# Patient Record
Sex: Female | Born: 2000 | Race: White | Hispanic: No | Smoking: Current some day smoker
Health system: Southern US, Community
[De-identification: ages and names within clinical notes are randomized; demographics above are authoritative.]

## PROBLEM LIST (undated history)

## (undated) DIAGNOSIS — F32A Depression, unspecified: Secondary | ICD-10-CM

## (undated) DIAGNOSIS — M329 Systemic lupus erythematosus, unspecified: Secondary | ICD-10-CM

## (undated) DIAGNOSIS — F419 Anxiety disorder, unspecified: Secondary | ICD-10-CM

## (undated) DIAGNOSIS — IMO0002 Reserved for concepts with insufficient information to code with codable children: Secondary | ICD-10-CM

## (undated) DIAGNOSIS — Z8619 Personal history of other infectious and parasitic diseases: Secondary | ICD-10-CM

## (undated) HISTORY — PX: WISDOM TOOTH EXTRACTION: SHX21

## (undated) HISTORY — PX: APPENDECTOMY: SHX54

## (undated) HISTORY — DX: Depression, unspecified: F32.A

## (undated) HISTORY — DX: Anxiety disorder, unspecified: F41.9

## (undated) HISTORY — DX: Personal history of other infectious and parasitic diseases: Z86.19

---
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
## 2019-02-25 ENCOUNTER — Other Ambulatory Visit: Payer: Self-pay

## 2019-02-25 DIAGNOSIS — Z8739 Personal history of other diseases of the musculoskeletal system and connective tissue: Secondary | ICD-10-CM | POA: Diagnosis not present

## 2019-02-25 DIAGNOSIS — Z79899 Other long term (current) drug therapy: Secondary | ICD-10-CM | POA: Diagnosis not present

## 2019-02-25 DIAGNOSIS — R0789 Other chest pain: Secondary | ICD-10-CM | POA: Diagnosis present

## 2019-02-25 DIAGNOSIS — J9801 Acute bronchospasm: Secondary | ICD-10-CM | POA: Insufficient documentation

## 2019-02-25 LAB — CBC
HCT: 42 % (ref 36.0–46.0)
Hemoglobin: 14.3 g/dL (ref 12.0–15.0)
MCH: 28.9 pg (ref 26.0–34.0)
MCHC: 34 g/dL (ref 30.0–36.0)
MCV: 85 fL (ref 80.0–100.0)
Platelets: 349 10*3/uL (ref 150–400)
RBC: 4.94 MIL/uL (ref 3.87–5.11)
RDW: 11.6 % (ref 11.5–15.5)
WBC: 7.7 10*3/uL (ref 4.0–10.5)
nRBC: 0 % (ref 0.0–0.2)

## 2019-02-25 MED ORDER — SODIUM CHLORIDE 0.9% FLUSH: 3.0000 mL | Freq: Once | INTRAVENOUS | Status: DC

## 2019-02-25 NOTE — ED Triage Notes (Signed)
Pt to the er for chest pain that is in the middle chest and then spreads across the whole chest and into the shoulders and her back. Pt states it makes it hard for her to breathe and pain is worse with breathing and lying down.

## 2019-02-26 ENCOUNTER — Emergency Department
Admission: EM | Admit: 2019-02-26 | Discharge: 2019-02-26 | Disposition: A | Discharge status: Home / Self Care | Payer: Medicaid Other | Attending: Emergency Medicine | Admitting: Emergency Medicine

## 2019-02-26 ENCOUNTER — Emergency Department: Payer: Medicaid Other

## 2019-02-26 DIAGNOSIS — J9801 Acute bronchospasm: Secondary | ICD-10-CM

## 2019-02-26 DIAGNOSIS — R0789 Other chest pain: Secondary | ICD-10-CM

## 2019-02-26 HISTORY — DX: Systemic lupus erythematosus, unspecified: M32.9

## 2019-02-26 HISTORY — DX: Reserved for concepts with insufficient information to code with codable children: IMO0002

## 2019-02-26 LAB — BASIC METABOLIC PANEL
Anion gap: 8 (ref 5–15)
BUN: 12 mg/dL (ref 6–20)
CO2: 25 mmol/L (ref 22–32)
Calcium: 9.2 mg/dL (ref 8.9–10.3)
Chloride: 104 mmol/L (ref 98–111)
Creatinine, Ser: 0.69 mg/dL (ref 0.44–1.00)
GFR calc Af Amer: >60 mL/min (ref >60)
GFR calc non Af Amer: >60 mL/min (ref >60)
Glucose, Bld: 79 mg/dL (ref 70–99)
Potassium: 4 mmol/L (ref 3.5–5.1)
Sodium: 137 mmol/L (ref 135–145)

## 2019-02-26 LAB — TROPONIN I (HIGH SENSITIVITY)
Troponin I (High Sensitivity): <2 ng/L (ref <18)
Troponin I (High Sensitivity): <2 ng/L (ref <18)

## 2019-02-26 LAB — FIBRIN DERIVATIVES D-DIMER (ARMC ONLY): Fibrin derivatives D-dimer (ARMC): 434 ng/mL (FEU) (ref 0.00–499.00)

## 2019-02-26 MED ORDER — ALBUTEROL SULFATE (2.5 MG/3ML) 0.083% IN NEBU
5.0000 mg | INHALATION_SOLUTION | Freq: Once | RESPIRATORY_TRACT | Status: AC
  Administered 2019-02-26: 5 mg via RESPIRATORY_TRACT

## 2019-02-26 MED ORDER — ALBUTEROL SULFATE HFA 108 (90 BASE) MCG/ACT IN AERS: 2.0000 | INHALATION_SPRAY | Freq: Four times a day (QID) | RESPIRATORY_TRACT | 0 refills | Status: DC | PRN

## 2019-02-26 NOTE — ED Notes (Signed)
ED Provider at bedside. 

## 2019-02-26 NOTE — ED Provider Notes (Signed)
[facility] Emergency Department Provider Note  ____________________________________________  Time seen: Approximately 4:44 AM  I have reviewed the triage vital signs and the nursing notes.   HISTORY  Chief Complaint Chest Pain   HPI Victoria Palmer is a 18 y.o. female   with a history of discoid lupus who presents for evaluation of chest pain.  Patient reports 3 days of constant chest tightness.  She denies shortness of breath but reports that the pain makes it harder for her to breathe.  The pain is worse when she lays down and better when she stands up.  No cough, no fever, no history of asthma, no history of smoking.  No recent illness, no nausea or vomiting.  No personal or family history of blood clots, no recent travel immobilization, no leg pain or swelling, no hemoptysis.  Patient is on birth control.  Currently on her menstrual period.  Past Medical History:  Diagnosis Date  . Lupus (HCC)     There are no active problems to display for this patient.   Past Surgical History:  Procedure Laterality Date  . APPENDECTOMY    . WISDOM TOOTH EXTRACTION      Prior to Admission medications   Medication Sig Start Date End Date Taking? Authorizing Provider  acetaminophen (TYLENOL) 325 MG tablet Take 650 mg by mouth every 6 (six) hours as needed. fever     [provider]  albuterol (VENTOLIN HFA) 108 (90 Base) MCG/ACT inhaler Inhale 2 puffs into the lungs every 6 (six) hours as needed for wheezing or shortness of breath. 02/26/19   Nita Sickle, MD  azithromycin [facility]) 200 MG/5ML suspension Take 10 mg/kg by mouth daily.      [provider]  pseudoephedrine-ibuprofen (CHILDREN'S MOTRIN COLD) 15-100 MG/5ML suspension Take 10 mLs by mouth 4 (four) times daily as needed. For fever     [provider]    Allergies Patient has no known allergies.  No family history on file.  Social History Social History    Tobacco Use  . Smoking status: Never Smoker  . Smokeless tobacco: Never Used  Substance Use Topics  . Alcohol use: Yes    Comment: occasionally  . Drug use: No    Review of Systems  Constitutional: Negative for fever. Eyes: Negative for visual changes. ENT: Negative for sore throat. Neck: No neck pain  Cardiovascular: Negative for chest pain. + chest tightness Respiratory: Negative for shortness of breath. Gastrointestinal: Negative for abdominal pain, vomiting or diarrhea. Genitourinary: Negative for dysuria. Musculoskeletal: Negative for back pain. Skin: Negative for rash. Neurological: Negative for headaches, weakness or numbness. Psych: No SI or HI  ____________________________________________   PHYSICAL EXAM:  VITAL SIGNS: ED Triage Vitals  Enc Vitals Group     BP 02/25/19 2338 130/61     Pulse Rate 02/25/19 2338 80     Resp 02/25/19 2338 18     Temp 02/25/19 2338 98.3 F (36.8 C)     Temp Source 02/25/19 2338 Oral     SpO2 02/25/19 2338 99 %     Weight 02/25/19 2331 280 lb (127 kg)     Height 02/25/19 2331 5\' 11"  (1.803 m)     Head Circumference --      Peak Flow --      Pain Score 02/25/19 2330 2     Pain Loc --      Pain Edu? --      Excl. in GC? --  Constitutional: Alert and oriented. Well appearing and in no apparent distress. HEENT:      Head: Normocephalic and atraumatic.         Eyes: Conjunctivae are normal. Sclera is non-icteric.       Mouth/Throat: Mucous membranes are moist.       Neck: Supple with no signs of meningismus. Cardiovascular: Regular rate and rhythm. No murmurs, gallops, or rubs. 2+ symmetrical distal pulses are present in all extremities. No JVD. Respiratory: Normal respiratory effort. Lungs are clear to auscultation bilaterally with diminished air movement bilaterally. No wheezes, crackles, or rhonchi.  Gastrointestinal: Soft, non tender, and non distended with positive bowel sounds. No rebound or guarding. Genitourinary:  No CVA tenderness. Musculoskeletal: Nontender with normal range of motion in all extremities. No edema, cyanosis, or erythema of extremities. Neurologic: Normal speech and language. Face is symmetric. Moving all extremities. No gross focal neurologic deficits are appreciated. Skin: Skin is warm, dry and intact. No rash noted. Psychiatric: Mood and affect are normal. Speech and behavior are normal.  ____________________________________________   LABS (all labs ordered are listed, but only abnormal results are displayed)  Labs Reviewed  BASIC METABOLIC PANEL  CBC  FIBRIN DERIVATIVES D-DIMER ([facility] ONLY)  POC URINE PREG, ED  TROPONIN I (HIGH SENSITIVITY)  TROPONIN I (HIGH SENSITIVITY)   ____________________________________________  EKG  ED ECG REPORT I, Nita Sicklearolina Ehab Humber, the attending physician, personally viewed and interpreted this ECG.  Normal sinus rhythm, rate of 85, normal intervals, normal axis, no ST elevations or depressions.  Normal EKG. ____________________________________________  RADIOLOGY  I have personally reviewed the images performed during this visit and I agree with the Radiologist's read.   Interpretation by Radiologist:  Dg Chest 2 View  Result Date: 02/26/2019 CLINICAL DATA:  Chest pain EXAM: CHEST - 2 VIEW COMPARISON:  None. FINDINGS: The heart size and mediastinal contours are within normal limits. Both lungs are clear. The visualized skeletal structures are unremarkable. IMPRESSION: No active cardiopulmonary disease. Electronically Signed   By: Jonna ClarkBindu  Avutu M.D.   On: 02/26/2019 00:23     ____________________________________________   PROCEDURES  Procedure(s) performed: None Procedures Critical Care performed:  None ____________________________________________   INITIAL IMPRESSION / ASSESSMENT AND PLAN / ED COURSE   18 y.o. female   with a history of discoid lupus who presents for evaluation of chest tightness x3 days.  Patient is  well-appearing in no distress with normal vitals, normal work of breathing, normal sats.  She does have diminished air movement bilaterally with no crackles or wheezes.  Chest x-ray with no pneumonia, pneumothorax, or pleural effusion.  EKG and troponin x2 with no evidence of ischemia.  Bedside ultrasound showing no pericardial effusion and normal EF.  Labs show no evidence of anemia.  D-dimer negative. Most likely bronchospasm with diminished air movement bilaterally.  After 5 mg of albuterol patient reports significant relief of her symptoms and is now moving more air with normal auscultation otherwise.  Will discharge home on albuterol and follow-up with PCP.  Discussed my standard return precautions       As part of my medical decision making, I reviewed the following data within the electronic MEDICAL RECORD NUMBER Nursing notes reviewed and incorporated, Labs reviewed , EKG interpreted , Old chart reviewed, Radiograph reviewed , Notes from prior ED visits and [place] Controlled Substance Database   Patient was evaluated in Emergency Department today for the symptoms described in the history of present illness. Patient was evaluated in the context of the global  COVID-19 pandemic, which necessitated consideration that the patient might be at risk for infection with the SARS-CoV-2 virus that causes COVID-19. Institutional protocols and algorithms that pertain to the evaluation of patients at risk for COVID-19 are in a state of rapid change based on information released by regulatory bodies including the CDC and federal and state organizations. These policies and algorithms were followed during the patient's care in the ED.   ____________________________________________   FINAL CLINICAL IMPRESSION(S) / ED DIAGNOSES   Final diagnoses:  Atypical chest pain  Bronchospasm      NEW MEDICATIONS STARTED DURING THIS VISIT:  ED Discharge Orders         Ordered    albuterol (VENTOLIN HFA) 108 (90 Base)  MCG/ACT inhaler  Every 6 hours PRN     02/26/19 0456           Note:  This document was prepared using Dragon voice recognition software and may include unintentional dictation errors.    Alfred Levins, [place], MD 02/26/19 [phone]

## 2019-02-26 NOTE — ED Notes (Signed)
Patient completed Breathing treatment

## 2019-02-26 NOTE — ED Notes (Signed)
Patient states having chest pain that is worse when lying down. Patient denies asthma, or lung issues. Patient states pressure is constance, with some tightness when lying down. EDP at bedside.

## 2019-12-11 ENCOUNTER — Other Ambulatory Visit: Payer: Self-pay

## 2019-12-11 ENCOUNTER — Emergency Department
Admission: EM | Admit: 2019-12-11 | Discharge: 2019-12-11 | Disposition: A | Discharge status: Home / Self Care | Payer: Medicaid Other | Attending: Emergency Medicine | Admitting: Emergency Medicine

## 2019-12-11 ENCOUNTER — Encounter: Payer: Self-pay | Admitting: Emergency Medicine

## 2019-12-11 ENCOUNTER — Emergency Department: Payer: Medicaid Other

## 2019-12-11 DIAGNOSIS — R0789 Other chest pain: Secondary | ICD-10-CM | POA: Insufficient documentation

## 2019-12-11 LAB — CBC
HCT: 45.4 % (ref 36.0–46.0)
Hemoglobin: 15.4 g/dL — ABNORMAL HIGH (ref 12.0–15.0)
MCH: 28.9 pg (ref 26.0–34.0)
MCHC: 33.9 g/dL (ref 30.0–36.0)
MCV: 85.3 fL (ref 80.0–100.0)
Platelets: 367 10*3/uL (ref 150–400)
RBC: 5.32 MIL/uL — ABNORMAL HIGH (ref 3.87–5.11)
RDW: 12.3 % (ref 11.5–15.5)
WBC: 9.7 10*3/uL (ref 4.0–10.5)
nRBC: 0 % (ref 0.0–0.2)

## 2019-12-11 LAB — LIPASE, BLOOD: Lipase: 33 U/L (ref 11–51)

## 2019-12-11 LAB — COMPREHENSIVE METABOLIC PANEL
ALT: 19 U/L (ref 0–44)
AST: 18 U/L (ref 15–41)
Albumin: 4.4 g/dL (ref 3.5–5.0)
Alkaline Phosphatase: 94 U/L (ref 38–126)
Anion gap: 8 (ref 5–15)
BUN: 13 mg/dL (ref 6–20)
CO2: 26 mmol/L (ref 22–32)
Calcium: 9.5 mg/dL (ref 8.9–10.3)
Chloride: 102 mmol/L (ref 98–111)
Creatinine, Ser: 0.67 mg/dL (ref 0.44–1.00)
GFR calc Af Amer: >60 mL/min (ref >60)
GFR calc non Af Amer: >60 mL/min (ref >60)
Glucose, Bld: 87 mg/dL (ref 70–99)
Potassium: 3.8 mmol/L (ref 3.5–5.1)
Sodium: 136 mmol/L (ref 135–145)
Total Bilirubin: 0.6 mg/dL (ref 0.3–1.2)
Total Protein: 8.1 g/dL (ref 6.5–8.1)

## 2019-12-11 LAB — URINALYSIS, COMPLETE (UACMP) WITH MICROSCOPIC
Bacteria, UA: NONE SEEN
Bilirubin Urine: NEGATIVE
Glucose, UA: NEGATIVE mg/dL
Hgb urine dipstick: NEGATIVE
Ketones, ur: NEGATIVE mg/dL
Leukocytes,Ua: NEGATIVE
Nitrite: NEGATIVE
Protein, ur: NEGATIVE mg/dL
Specific Gravity, Urine: 1.019 (ref 1.005–1.030)
pH: 5 (ref 5.0–8.0)

## 2019-12-11 LAB — TROPONIN I (HIGH SENSITIVITY)
Troponin I (High Sensitivity): 3 ng/L (ref <18)
Troponin I (High Sensitivity): 3 ng/L (ref <18)

## 2019-12-11 LAB — POCT PREGNANCY, URINE: Preg Test, Ur: NEGATIVE

## 2019-12-11 MED ORDER — SODIUM CHLORIDE 0.9% FLUSH: 3.0000 mL | Freq: Once | INTRAVENOUS | Status: DC

## 2019-12-11 NOTE — ED Provider Notes (Signed)
[facility] Emergency Department Provider Note ____________________________________________   First MD Initiated Contact with Patient 12/11/19 1630     (approximate)  I have reviewed the triage vital signs and the nursing notes.  HISTORY  Chief Complaint Chest Pain and Abdominal Pain   HPI Victoria Palmer is a 19 y.o. female presenting with intermittent chest pain.   Obese female that carries the diagnosis of lupus.  Patient reports intermittent sharp chest pains while at work today.  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  She denies any associated nausea, vomiting or diaphoresis with these episodes.  Denies syncope.  She denies any pain here in the ED and has no complaints.  She reports feeling better.  Denies recent illnesses, fevers, respiratory infections, cough, productive cough, abdominal pain vomiting.    Past Medical History:  Diagnosis Date  . Lupus (HCC)     There are no problems to display for this patient.   Past Surgical History:  Procedure Laterality Date  . APPENDECTOMY    . WISDOM TOOTH EXTRACTION      Prior to Admission medications   Medication Sig Start Date End Date Taking? Authorizing Provider  acetaminophen (TYLENOL) 325 MG tablet Take 650 mg by mouth every 6 (six) hours as needed. fever     [provider]  albuterol (VENTOLIN HFA) 108 (90 Base) MCG/ACT inhaler Inhale 2 puffs into the lungs every 6 (six) hours as needed for wheezing or shortness of breath. 02/26/19   Nita Sickle, MD  azithromycin [facility]) 200 MG/5ML suspension Take 10 mg/kg by mouth daily.      [provider]  pseudoephedrine-ibuprofen (CHILDREN'S MOTRIN COLD) 15-100 MG/5ML suspension Take 10 mLs by mouth 4 (four) times daily as needed. For fever      [provider]    Allergies Tea tree oil  No family history on file.  Social History Social History   Tobacco Use  . Smoking status: Never Smoker  . Smokeless tobacco: Never Used  Substance Use Topics  . Alcohol use: Yes    Comment: occasionally  . Drug use: No    Review of Systems  Constitutional: No fever/chills Eyes: No visual changes. ENT: No sore throat. Cardiovascular: Positive for chest pain Respiratory: Denies shortness of breath. Gastrointestinal: No abdominal pain.  No nausea, no vomiting.  No diarrhea.  No constipation. Genitourinary: Negative for dysuria. Musculoskeletal: Negative for back pain. Skin: Negative for rash. Neurological: Negative for headaches, focal weakness or numbness.   ____________________________________________   PHYSICAL EXAM:  VITAL SIGNS: ED Triage Vitals  Enc Vitals Group     BP 12/11/19 1416 (!) 137/74     Pulse Rate 12/11/19 1416 86     Resp 12/11/19 1416 22     Temp 12/11/19 1416 97.8 F (36.6 C)     Temp Source 12/11/19 1416 Oral     SpO2 12/11/19 1416 99 %     Weight 12/11/19 1417 (!) 290 lb (131.5 kg)     Height 12/11/19 1417 5\' 7"  (1.702 m)     Head Circumference --      Peak Flow --      Pain Score 12/11/19 1416 7     Pain Loc --      Pain Edu? --      Excl. in GC? --  Constitutional: Alert and oriented. Well appearing and in no acute distress.  Obese.  Conversational in full sentences without distress. Eyes: Conjunctivae are normal. PERRL. EOMI. Head: Atraumatic. Nose: No congestion/rhinnorhea. Mouth/Throat: Mucous membranes are moist.  Oropharynx non-erythematous. Neck: No stridor. No cervical spine tenderness to palpation. Cardiovascular: Normal rate, regular rhythm. Grossly normal heart sounds.  Good peripheral circulation. Respiratory: Normal respiratory effort.  No retractions. Lungs CTAB. Gastrointestinal: Soft , nondistended, nontender to palpation. No abdominal bruits. No CVA  tenderness. Musculoskeletal: No lower extremity tenderness nor edema.  No joint effusions. No signs of acute trauma. Some mild MSK tenderness to palpation to the affected areas of the peripheries of her bilateral chest walls without overlying signs of trauma or skin changes. Neurologic:  Normal speech and language. No gross focal neurologic deficits are appreciated. No gait instability noted. Skin:  Skin is warm, dry and intact. No rash noted. Psychiatric: Mood and affect are normal. Speech and behavior are normal.  ____________________________________________   LABS (all labs ordered are listed, but only abnormal results are displayed)  Labs Reviewed  CBC - Abnormal; Notable for the following components:      Result Value   RBC 5.32 (*)    Hemoglobin 15.4 (*)    All other components within normal limits  URINALYSIS, COMPLETE (UACMP) WITH MICROSCOPIC - Abnormal; Notable for the following components:   Color, Urine YELLOW (*)    APPearance CLEAR (*)    All other components within normal limits  LIPASE, BLOOD  COMPREHENSIVE METABOLIC PANEL  POC URINE PREG, ED  POCT PREGNANCY, URINE  TROPONIN I (HIGH SENSITIVITY)  TROPONIN I (HIGH SENSITIVITY)   ____________________________________________  12 Lead EKG Sinus rhythm, rate of 77 bpm.  Normal axis and intervals.  No evidence of acute ischemia.  ____________________________________________  RADIOLOGY  ED MD interpretation: 2 view CXR obtained due to symptoms of chest pain, results reviewed and without evidence of acute cardiopulmonary pathology  Official radiology report(s): DG Chest 2 View  Result Date: 12/11/2019 CLINICAL DATA:  Intermittent chest pain in generalized abdominal pain which began today, history of lupus EXAM: CHEST - 2 VIEW COMPARISON:  Radiograph 02/26/2019 FINDINGS: No consolidation, features of edema, pneumothorax, or effusion. Pulmonary vascularity is normally distributed. The cardiomediastinal contours are  unremarkable. No acute osseous or soft tissue abnormality. IMPRESSION: No acute cardiopulmonary abnormality. Electronically Signed   By: Kreg Shropshire M.D.   On: 12/11/2019 15:10    ____________________________________________   PROCEDURES  Procedure(s) performed (including Critical Care):  Procedures   ____________________________________________   INITIAL IMPRESSION / ASSESSMENT AND PLAN / ED COURSE  19 year old female presenting with atypical chest pains without evidence of acute pathology.  Normal vital signs.  Exam with reproducible chest pain on palpation and an obese female, without evidence of additional acute pathology.  Unremarkable blood work.  Nonischemic EKG.  Patient extremely low risk and her symptoms are intermittent, and fleeting in nature.  She has no symptoms here in the ED, was observed for multiple hours without evidence of acute pathology.  Advise follow-up with PCP and return precautions for the ED were discussed.  Medically stable for discharge home.  ____________________________________________   FINAL CLINICAL IMPRESSION(S) / ED DIAGNOSES  Final diagnoses:  Chest wall pain     ED Discharge Orders    None       Sanoe Hazan   Note:  This document was prepared using Dragon voice recognition software and may include unintentional dictation errors.   Delton Prairie, MD 12/11/19 [phone]

## 2019-12-11 NOTE — Discharge Instructions (Signed)
You were seen in the ED because of your chest pains while at work today.  I see no evidence of heart attack, strain or damage in your heart, or other more serious conditions.  It is possible, and likely, this is a combination of stress and straining of the small muscles within your chest wall.  If you develop any worsening chest pains, especially commendation with fevers or passing out, please return to the ED

## 2019-12-11 NOTE — ED Triage Notes (Signed)
Pt presents to ED via POV with c/o intermittent generalized CP and generalized abdominal pain that started today. Pt is A&O x4, NAD noted at this time, pt requesting work note in triage/

## 2020-09-19 HISTORY — PX: BREAST REDUCTION SURGERY: SHX8

## 2022-02-07 ENCOUNTER — Encounter: Payer: Self-pay | Admitting: Adult Health

## 2022-02-07 ENCOUNTER — Ambulatory Visit (INDEPENDENT_AMBULATORY_CARE_PROVIDER_SITE_OTHER): Payer: 59 | Admitting: Adult Health

## 2022-02-07 VITALS — BP 116/76 | HR 91 | Ht 67.0 in | Wt 366.0 lb

## 2022-02-07 DIAGNOSIS — R350 Frequency of micturition: Secondary | ICD-10-CM | POA: Diagnosis not present

## 2022-02-07 DIAGNOSIS — N920 Excessive and frequent menstruation with regular cycle: Secondary | ICD-10-CM | POA: Insufficient documentation

## 2022-02-07 DIAGNOSIS — R102 Pelvic and perineal pain: Secondary | ICD-10-CM

## 2022-02-07 DIAGNOSIS — N946 Dysmenorrhea, unspecified: Secondary | ICD-10-CM

## 2022-02-07 DIAGNOSIS — N941 Unspecified dyspareunia: Secondary | ICD-10-CM | POA: Diagnosis not present

## 2022-02-07 LAB — POCT URINALYSIS DIPSTICK
Blood, UA: NEGATIVE
Glucose, UA: NEGATIVE
Ketones, UA: NEGATIVE
Leukocytes, UA: NEGATIVE
Nitrite, UA: NEGATIVE
Protein, UA: NEGATIVE

## 2022-02-07 NOTE — Progress Notes (Signed)
Subjective:     Patient ID: Victoria Palmer, female   DOB: 07-02-2000, 21 y.o.   MRN: [id number]  HPI Victoria Palmer is a 21 year old white female,single, G0P0, in with her Mom, referred by [facility] for pain with sex.  She has list of following complaints: pain with sex last 1.5 months, burning with penetration, feels dry and like vagina closing up. Has pelvic pain, bloating, nausea and vomiting and urinary frequency, periods last 3-4 days and regular but heavy days 2-3 and changes cloth pad every 4 hours, has pain on period and hurts to walk days 2-3. Clitoris sore and painful when aroused and uncomfortable between legs when aroused too.  She has cousin with endometriosis. She had normal TSH at [facility].  She had negative pap 11/17/21 and negative GC/CHL then too.   Review of Systems Did not have pain when masturbated with lubricated hair brush See HPI for positives    Reviewed past medical,surgical, social and family history. Reviewed medications and allergies.  Objective:   Physical Exam BP 116/76 (BP Location: Left Arm, Patient Position: Sitting, Cuff Size: Normal)   Pulse 91   Ht 5\' 7"  (1.702 m)   Wt (!) 366 lb (166 kg)   LMP 01/30/2022   BMI 57.32 kg/m  urine dipstick was negative.    Skin warm and dry.Pelvic: external genitalia is normal in appearance no lesions, has pressure but no pain when touched, vagina:pink and moist,urethra has no lesions or masses noted, cervix:smooth, no CMT, uterus: normal size, shape and contour, non tender, no masses felt, adnexa: no masses or tenderness noted. Bladder is non tender and no masses felt. AA is 3 Fall risk is moderate    02/07/2022    2:11 PM  Depression screen PHQ 2/9  Decreased Interest 2  Down, Depressed, Hopeless 2  PHQ - 2 Score 4  Altered sleeping 3  Tired, decreased energy 2  Change in appetite 1  Feeling bad or failure about yourself  2  Trouble concentrating 2  Moving slowly or fidgety/restless 0  Suicidal thoughts 0  PHQ-9 Score  14   Goes to therapy    02/07/2022    2:12 PM  GAD 7 : Generalized Anxiety Score  Nervous, Anxious, on Edge 2  Control/stop worrying 1  Worry too much - different things 1  Trouble relaxing 1  Restless 1  Easily annoyed or irritable 1  Afraid - awful might happen 0  Total GAD 7 Score 7    Upstream - 02/07/22 1433       Pregnancy Intention Screening   Does the patient want to become pregnant in the next year? Ok Either Way    Does the patient's partner want to become pregnant in the next year? Ok Either Way    Would the patient like to discuss contraceptive options today? No      Contraception Wrap Up   Current Method Female Condom    End Method Female Condom            Examination chaperoned by Levy Pupa LPN   Assessment:     1. Urinary frequency Urine was negative  - POCT Urinalysis Dipstick  2. Dyspareunia, female Has has pain with penetration last 1.5 months, same partner for 8 months, no forced sex or trauma Try good lubricate with sex,like astroglide, Coconut oil or olive oil Try luvena or replens for vaginal moisture Try different positions with sex If pain persists will refer for Pelvic floor PT(she says car may  not make it to [place], she lives in [place])  3. Pelvic pain May get [name] in near future  4. Menorrhagia with regular cycle Periods last 3-4 days with days 2-3 heavy and painful, changes cloth pad every 4 hours Hurts to walk days 2-3 Will check labs, 02/20/22 - Progesterone - LH - Estradiol  5. Dysmenorrhea She had declined OCs at PCP    Plan:     Follow up in 3 weeks for ROS She is aware that is a step process  Time with spent 55 minutes with pt and reviewing chart, and discussing plan of care.

## 2022-02-21 LAB — PROGESTERONE: Progesterone: 0.8 ng/mL

## 2022-02-21 LAB — ESTRADIOL: Estradiol: 160 pg/mL

## 2022-02-21 LAB — LUTEINIZING HORMONE: LH: 39.6 m[IU]/mL

## 2022-02-27 IMAGING — CR DG CHEST 2V
1 series · 2 of 2 positions shown · non-contrast
Comparison: Radiograph 02/26/2019

CLINICAL DATA: Intermittent chest pain in generalized abdominal
pain which began today, history of lupus

EXAM:
CHEST - 2 VIEW

[Series 1: dg chest 2 view · 0.14mm/px · 2 of 2 slices shown]
[im 1/2]
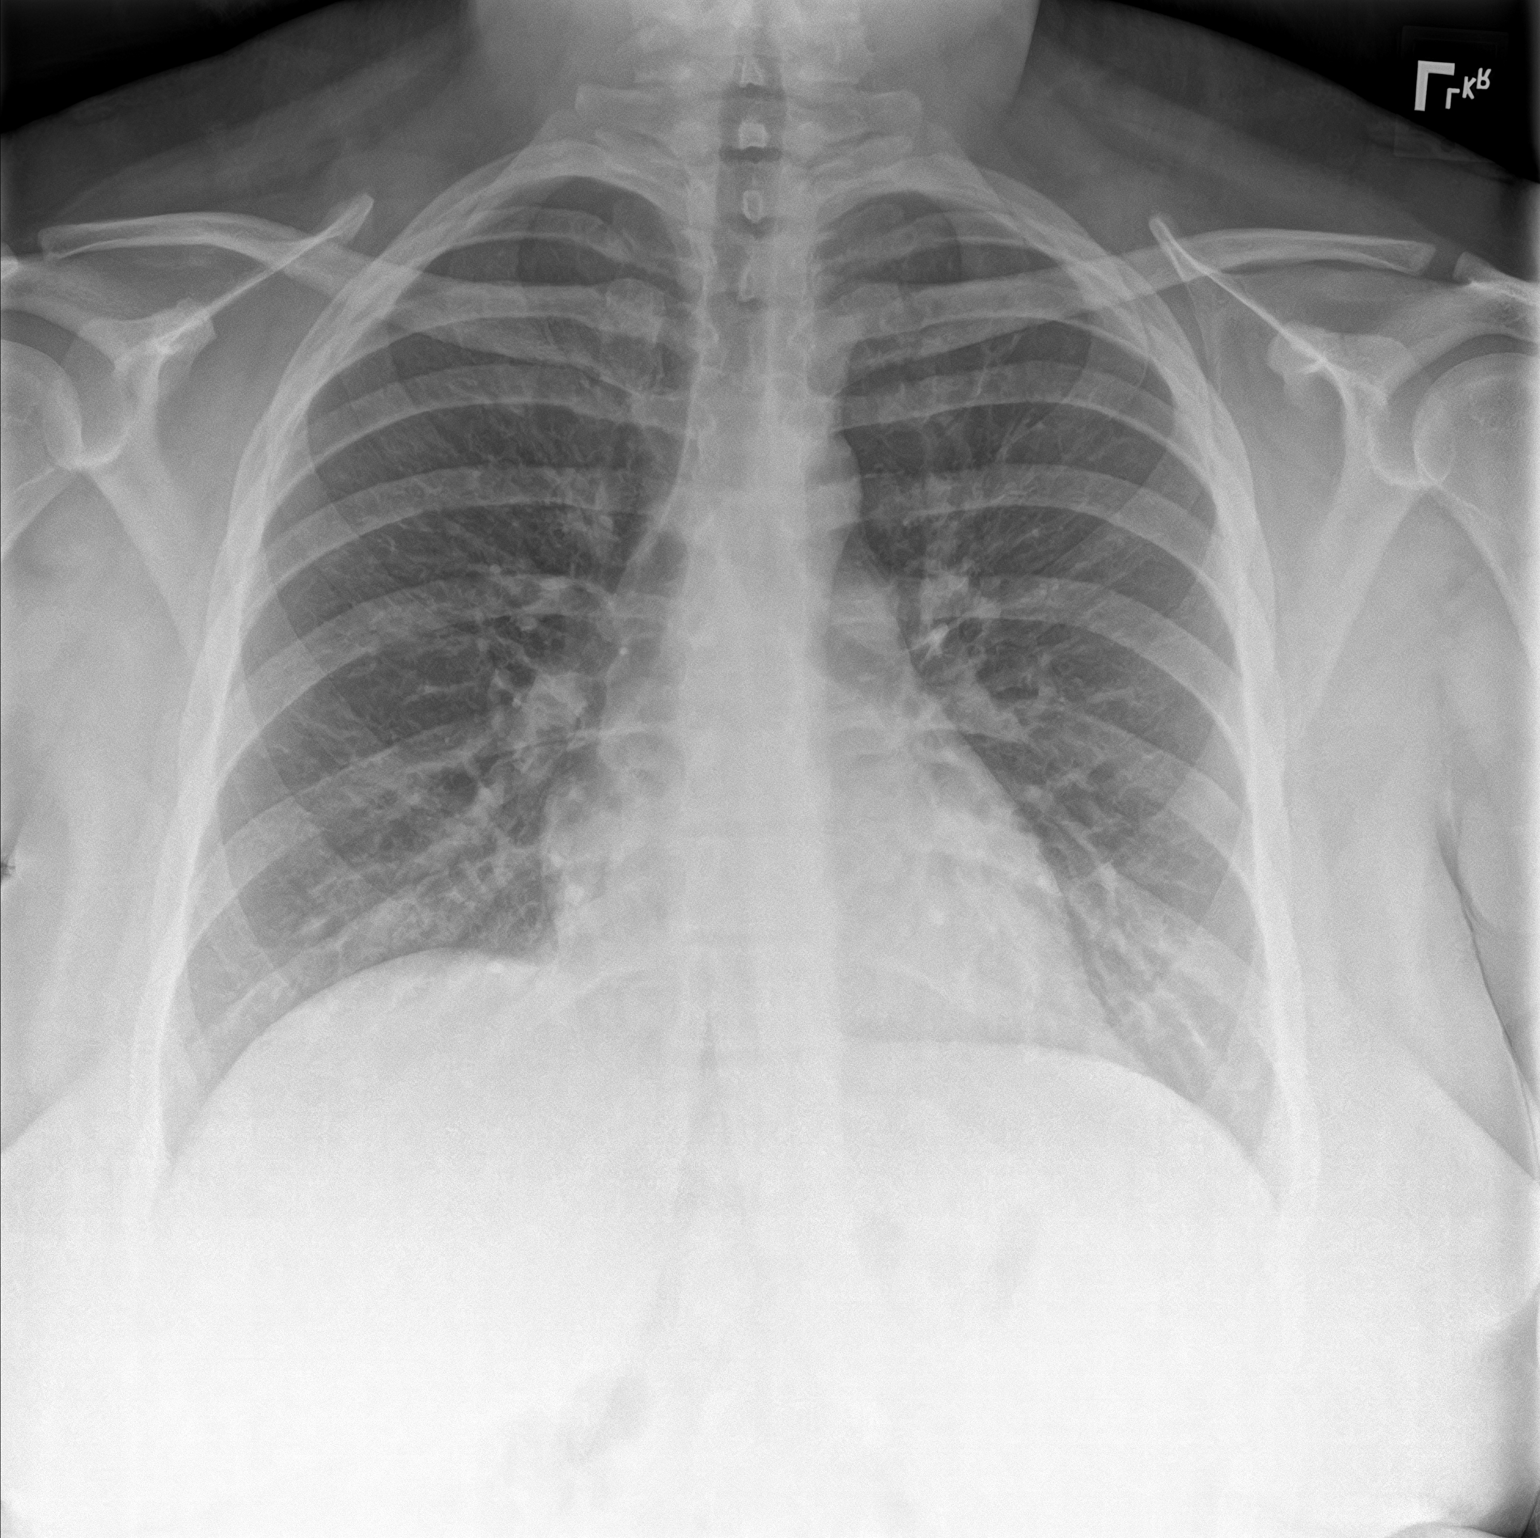
[im 2/2]
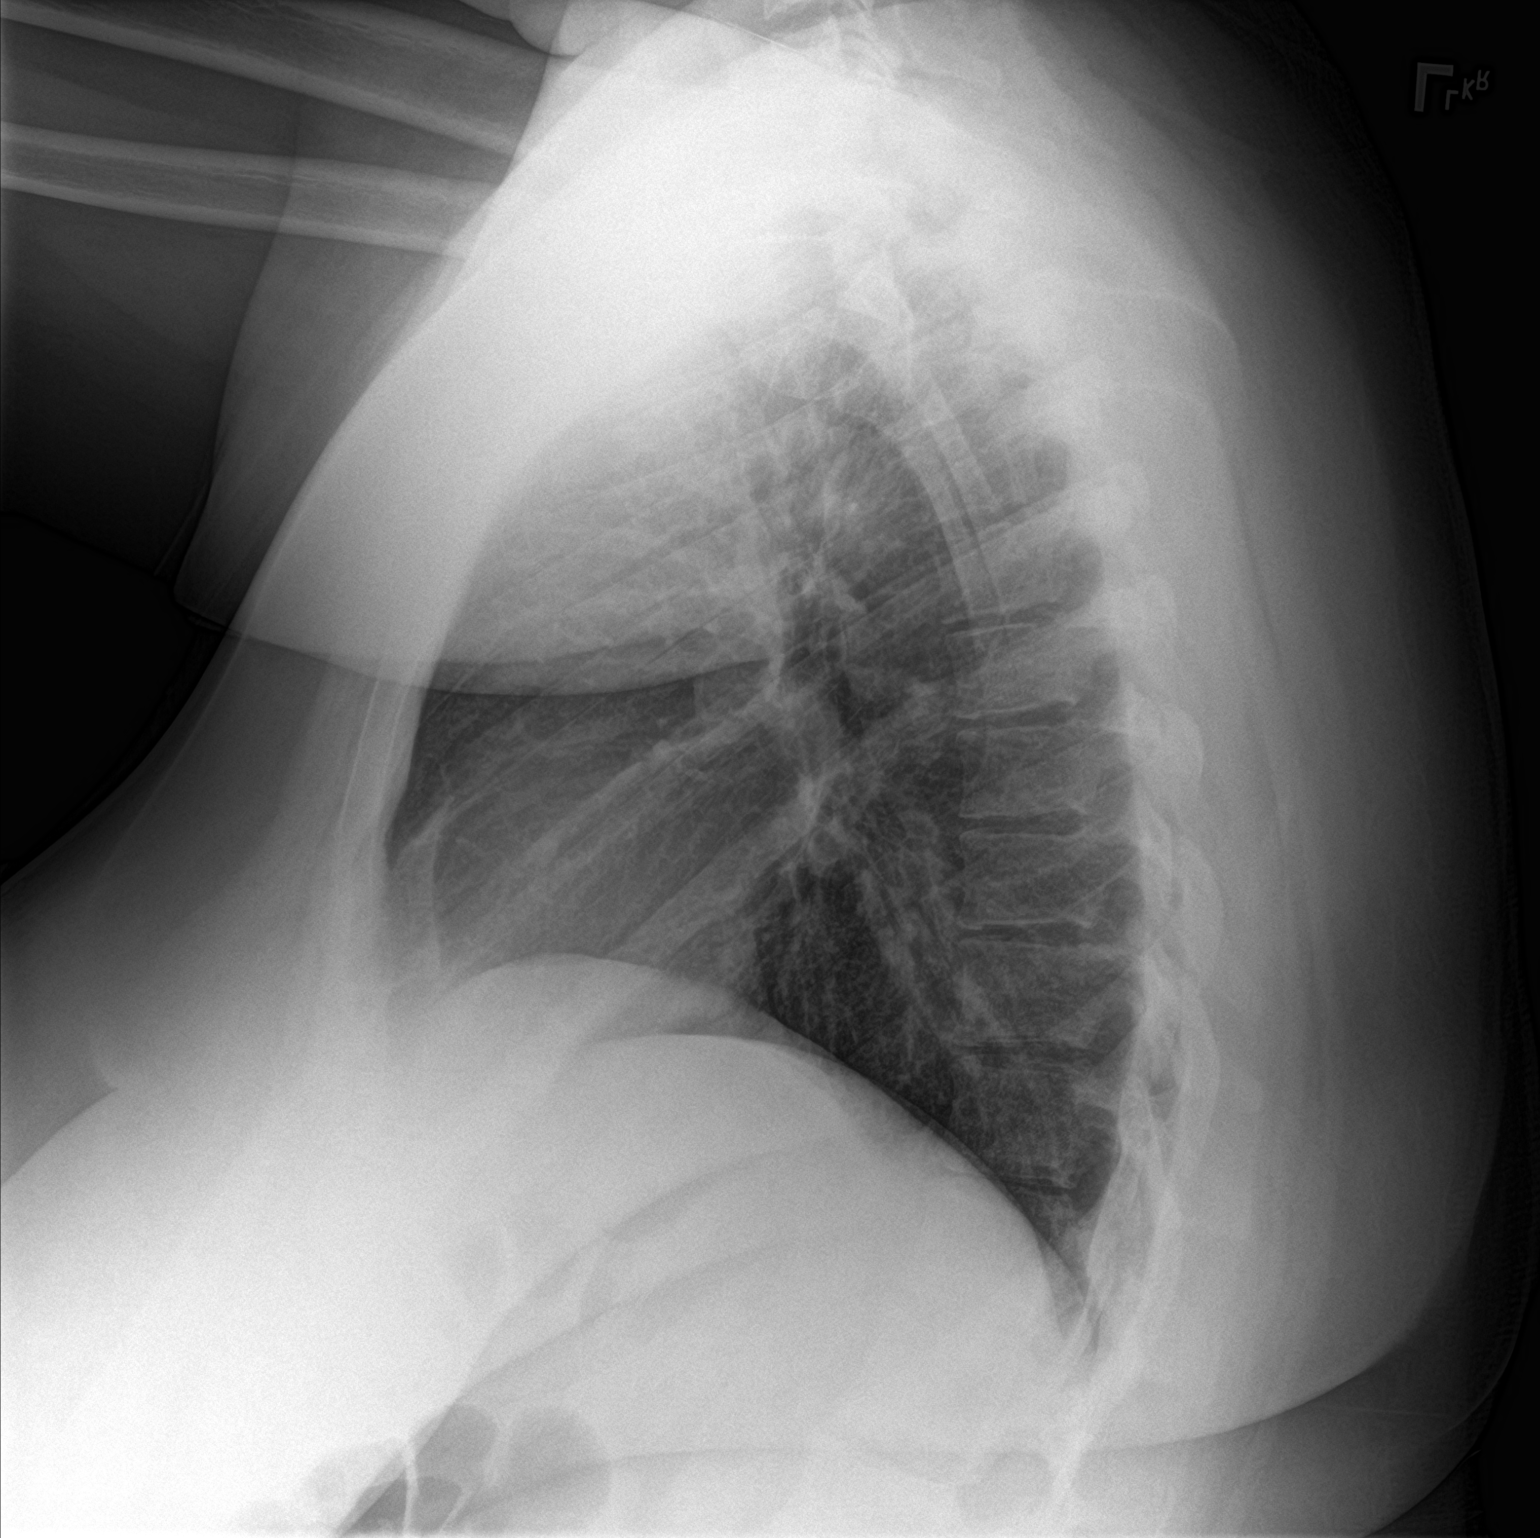

[2 of 2 positions shown; findings below may reference images not displayed]

FINDINGS: No consolidation, features of edema, pneumothorax, or effusion.
Pulmonary vascularity is normally distributed. The cardiomediastinal
contours are unremarkable. No acute osseous or soft tissue
abnormality.
IMPRESSION: No acute cardiopulmonary abnormality.

## 2022-02-28 ENCOUNTER — Encounter: Payer: Self-pay | Admitting: Adult Health

## 2022-02-28 ENCOUNTER — Ambulatory Visit: Payer: 59 | Admitting: Adult Health

## 2022-02-28 VITALS — BP 144/95 | HR 100 | Ht 69.0 in | Wt 370.8 lb

## 2022-02-28 DIAGNOSIS — N946 Dysmenorrhea, unspecified: Secondary | ICD-10-CM

## 2022-02-28 DIAGNOSIS — N941 Unspecified dyspareunia: Secondary | ICD-10-CM | POA: Diagnosis not present

## 2022-02-28 DIAGNOSIS — N920 Excessive and frequent menstruation with regular cycle: Secondary | ICD-10-CM

## 2022-02-28 NOTE — Progress Notes (Deleted)
  Subjective:     Patient ID: Victoria Palmer, female   DOB: [birth year], 21 y.o.   MRN: [id number]  HPI   Review of Systems     Objective:   Physical Exam BP (!) 144/95 (BP Location: Left Arm, Patient Position: Sitting, Cuff Size: Normal)   Pulse 100   Ht 5\' 9"  (1.753 m)   Wt (!) 370 lb 12.8 oz (168.2 kg)   LMP 01/30/2022   BMI 54.76 kg/m      Assessment:     ***    Plan:     ***

## 2022-02-28 NOTE — Progress Notes (Signed)
  Subjective:     Patient ID: Victoria Palmer, female   DOB: 03/24/2001, 21 y.o.   MRN: [id number]  HPI Onnika is a 21 year old white female, with SO, G0P0, back in follow up with her mom.  She had labs 02/20/22 progesterone was 0.2, LH was 39.6, E2 was 160.0. Pain with sex has resolved, periods heavy and has cramps.  Last pap was 11/17/21 and negative. Review of Systems  Heavy periods Period cramps Pain with sex has resolved  Reviewed past medical,surgical, social and family history. Reviewed medications and allergies.    Objective:   Physical Exam BP (!) 144/95 (BP Location: Left Arm, Patient Position: Sitting, Cuff Size: Normal)   Pulse 100   Ht 5\' 9"  (1.753 m)   Wt (!) 370 lb 12.8 oz (168.2 kg)   LMP 01/30/2022   BMI 54.76 kg/m     Skin warm and dry.Lungs: clear to ausculation bilaterally. Cardiovascular: regular rate and rhythm.   Upstream - 02/28/22 1513       Pregnancy Intention Screening   Does the patient want to become pregnant in the next year? Ok Either Way    Does the patient's partner want to become pregnant in the next year? Ok Either Way    Would the patient like to discuss contraceptive options today? No      Contraception Wrap Up   Current Method No Method - Other Reason    End Method No Method - Other Reason             Assessment:     1. Menorrhagia with regular cycle Periods last 3-4 days and heavy 2-3 days  Will get pelvic US 03/08/22 at 2:30 pm to assess uterus and ovaries and rule out PCOs  2. Dysmenorrhea Has pain with periods She declined OCs at PCP  3. Dyspareunia, Resolved    Plan:     Will talk when [name] results back

## 2022-03-08 ENCOUNTER — Ambulatory Visit (HOSPITAL_COMMUNITY)
Admission: RE | Admit: 2022-03-08 | Discharge: 2022-03-08 | Disposition: A | Discharge status: Home / Self Care | Payer: 59 | Source: Ambulatory Visit | Attending: Adult Health | Admitting: Adult Health

## 2022-03-08 DIAGNOSIS — N946 Dysmenorrhea, unspecified: Secondary | ICD-10-CM | POA: Insufficient documentation

## 2022-03-08 DIAGNOSIS — N920 Excessive and frequent menstruation with regular cycle: Secondary | ICD-10-CM | POA: Insufficient documentation

## 2022-04-04 ENCOUNTER — Telehealth (INDEPENDENT_AMBULATORY_CARE_PROVIDER_SITE_OTHER): Payer: 59 | Admitting: Obstetrics & Gynecology

## 2022-04-04 ENCOUNTER — Encounter: Payer: Self-pay | Admitting: Obstetrics & Gynecology

## 2022-04-04 VITALS — Ht 68.0 in

## 2022-04-04 DIAGNOSIS — Z3009 Encounter for other general counseling and advice on contraception: Secondary | ICD-10-CM

## 2022-04-04 DIAGNOSIS — N941 Unspecified dyspareunia: Secondary | ICD-10-CM | POA: Diagnosis not present

## 2022-04-04 NOTE — Progress Notes (Signed)
TELEHEALTH GYNECOLOGY VISIT ENCOUNTER NOTE  Provider location: [facility]   Patient location: Home  I connected with Victoria Palmer on 04/04/22 at  4:10 PM EST by telephone and verified that I am speaking with the correct person using two identifiers. Patient was unable to do MyChart audiovisual encounter due to technical difficulties, she tried several times.    I discussed the limitations, risks, security and privacy concerns of performing an evaluation and management service by telephone and the availability of in person appointments. I also discussed with the patient that there may be a patient responsible charge related to this service. The patient expressed understanding and agreed to proceed.   History:  Victoria Palmer is a 21 y.o. G0P0000 female being evaluated today for follow up regarding dyspareunia  She notes that for about 2 mos she had considerable pain with intercourse.  Pain was vaginal- described it as burning.  As part of her work up, pelvic US was completed.  [name] reviewed with patient today- no acute abnormalities noted.  Eventually, the discomfort seems to have resolved on its own.  She notes that it seems the burning on occasion will return if they have sex "a lot" and when she takes a break it's not as bad. She denies any abnormal vaginal discharge, bleeding, or odor.  Of note, she would consider a pregnancy in the future.  Menses are regular each month, but she is concerned that she is not ovulating.  Blood work was completed; however, not sure if this indeed was completed on Day 21.     Past Medical History:  Diagnosis Date   Anxiety    Depression    History of mononucleosis    Lupus (HCC)    Past Surgical History:  Procedure Laterality Date   APPENDECTOMY     BREAST REDUCTION SURGERY  09/2020   WISDOM TOOTH EXTRACTION     The following portions of the patient's history were reviewed and updated as appropriate: allergies,  current medications, past family history, past medical history, past social history, past surgical history and problem list.   Health Maintenance:  Normal pap and negative HRHPV on 10/2021.   Review of Systems:  Pertinent items noted in HPI and remainder of comprehensive ROS otherwise negative.  Physical Exam:   General:  Alert, oriented and cooperative.   Mental Status: Normal mood and affect perceived. Normal judgment and thought content.  Physical exam deferred due to nature of the encounter  Labs and Imaging No results found for this or any previous visit (from the past 336 hour(s)). US PELVIC COMPLETE WITH TRANSVAGINAL  Result Date: 03/09/2022 CLINICAL DATA:  Initial evaluation for menorrhagia, dysmenorrhea. EXAM: TRANSABDOMINAL AND TRANSVAGINAL ULTRASOUND OF PELVIS TECHNIQUE: Both transabdominal and transvaginal ultrasound examinations of the pelvis were performed. Transabdominal technique was performed for global imaging of the pelvis including uterus, ovaries, adnexal regions, and pelvic cul-de-sac. It was necessary to proceed with endovaginal exam following the transabdominal exam to visualize the endometrium. COMPARISON:  CT from 01/07/2011. FINDINGS: Uterus Measurements: 8.9 x 3.3 x 3.4 cm = volume: 52.0 mL. Uterus is anteverted. No discrete fibroid or other myometrial abnormality. Endometrium Thickness: 4 mm.  No focal abnormality visualized. Right ovary Measurements: 3.1 x 1.7 x 1.6 cm = volume: 4.3 mL. Normal appearance/no adnexal mass. Left ovary Measurements: 3.0 x 2.2 x 3.0 cm = volume: 10.3 mL. Normal appearance/no adnexal mass. Other findings No abnormal free fluid. IMPRESSION: 1. Normal pelvic ultrasound. 2. Endometrial  stripe measures 4 mm in thickness. If bleeding remains unresponsive to hormonal or medical therapy, sonohysterogram should be considered for focal lesion work-up. (Ref: Radiological Reasoning: Algorithmic Workup of Abnormal Vaginal Bleeding with Endovaginal  Sonography and Sonohysterography. AJR 2008; 607:P71-06). Electronically Signed   By: Rise Mu M.D.   On: 03/09/2022 05:03      Assessment and Plan:  -Dyspareunia -Family planning  Reassured pt that there are no underlying medical abnormalities to explain her dyspareunia.  Reviewed conservative management to help improve her symptoms Should pain return, RTC  -Discussed ovulation and tracking of menses -encouraged "healthy you" -F/U prn or once it has been 55yr of actively trying -next visit in June for annual   I discussed the assessment and treatment plan with the patient. The patient was provided an opportunity to ask questions and all were answered. The patient agreed with the plan and demonstrated an understanding of the instructions.   The patient was advised to call back or seek an in-person evaluation/go to the ED if the symptoms worsen or if the condition fails to improve as anticipated.  I provided 15 minutes of non-face-to-face time during this encounter.   Sharon Seller, DO [facility] [employer], [facility]

## 2022-09-12 ENCOUNTER — Encounter: Payer: Self-pay | Admitting: Gastroenterology
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
## 2022-09-28 ENCOUNTER — Ambulatory Visit: Payer: 59 | Admitting: Gastroenterology
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
## 2022-10-09 ENCOUNTER — Encounter: Payer: Self-pay | Admitting: Gastroenterology

## 2022-10-09 ENCOUNTER — Encounter: Payer: Self-pay | Admitting: *Deleted

## 2022-10-09 ENCOUNTER — Other Ambulatory Visit: Payer: Self-pay | Admitting: *Deleted

## 2022-10-09 ENCOUNTER — Ambulatory Visit (INDEPENDENT_AMBULATORY_CARE_PROVIDER_SITE_OTHER): Payer: 59 | Admitting: Gastroenterology

## 2022-10-09 VITALS — BP 123/76 | HR 75 | Temp 98.0°F | Ht 68.0 in | Wt 374.4 lb

## 2022-10-09 DIAGNOSIS — R112 Nausea with vomiting, unspecified: Secondary | ICD-10-CM | POA: Diagnosis not present

## 2022-10-09 DIAGNOSIS — K219 Gastro-esophageal reflux disease without esophagitis: Secondary | ICD-10-CM | POA: Diagnosis not present

## 2022-10-09 NOTE — Patient Instructions (Signed)
Upper endoscopy to be scheduled.  

## 2022-11-09 NOTE — Patient Instructions (Addendum)
Victoria Palmer  11/09/2022     @[email or web address] @   Your procedure is scheduled on  11/14/2022.   Report to [name] at  1130  A.M.   Call this number if you have problems the morning of surgery:  [phone]  If you experience any cold or flu symptoms such as cough, fever, chills, shortness of breath, etc. between now and your scheduled surgery, please notify us at the above number.   Remember:  Follow the diet instructions given to you by the office.     Take these medicines the morning of surgery with A SIP OF WATER                                                    None.    Do not wear jewelry, make-up or nail polish, including gel polish,  artificial nails, or any other type of covering on natural nails (fingers and  toes).  Do not wear lotions, powders, or perfumes, or deodorant.  Do not shave 48 hours prior to surgery.  Men may shave face and neck.  Do not bring valuables to the hospital.  [facility] is not responsible for any belongings or valuables.  Contacts, dentures or bridgework may not be worn into surgery.  Leave your suitcase in the car.  After surgery it may be brought to your room.  For patients admitted to the hospital, discharge time will be determined by your treatment team.  Patients discharged the day of surgery will not be allowed to drive home and must have someone with them for 24 hours.    Special instructions:   DO NOT smoke tobacco or vape for 24 hours before your procedure.  Please read over the following fact sheets that you were given. Anesthesia Post-op Instructions and Care and Recovery After Surgery       Upper Endoscopy, Adult, Care After After the procedure, it is common to have a sore throat. It is also common to have: Mild stomach pain or discomfort. Bloating. Nausea. Follow these instructions at home: The instructions below may help you care for yourself at home. Your health care provider may give you more  instructions. If you have questions, ask your health care provider. If you were given a sedative during the procedure, it can affect you for several hours. Do not drive or operate machinery until your health care provider says that it is safe. If you will be going home right after the procedure, plan to have a responsible adult: Take you home from the hospital or clinic. You will not be allowed to drive. Care for you for the time you are told. Follow instructions from your health care provider about what you may eat and drink. Return to your normal activities as told by your health care provider. Ask your health care provider what activities are safe for you. Take over-the-counter and prescription medicines only as told by your health care provider. Contact a health care provider if you: Have a sore throat that lasts longer than one day. Have trouble swallowing. Have a fever. Get help right away if you: Vomit blood or your vomit looks like coffee grounds. Have bloody, black, or tarry stools. Have a very bad sore throat or you cannot swallow. Have difficulty breathing or very bad pain in your  chest or abdomen. These symptoms may be an emergency. Get help right away. Call 911. Do not wait to see if the symptoms will go away. Do not drive yourself to the hospital. Summary After the procedure, it is common to have a sore throat, mild stomach discomfort, bloating, and nausea. If you were given a sedative during the procedure, it can affect you for several hours. Do not drive until your health care provider says that it is safe. Follow instructions from your health care provider about what you may eat and drink. Return to your normal activities as told by your health care provider. This information is not intended to replace advice given to you by your health care provider. Make sure you discuss any questions you have with your health care provider. Document Revised: 08/17/2021 Document Reviewed:  08/17/2021 Elsevier Patient Education  2024 Elsevier Inc.  Esophageal Dilatation Esophageal dilatation, also called esophageal dilation, is a procedure to widen or open a blocked or narrowed part of the esophagus. The esophagus is the part of the body that moves food and liquid from the mouth to the stomach. You may need this procedure if: You have a buildup of scar tissue in your esophagus that makes it difficult, painful, or impossible to swallow. This can be caused by gastroesophageal reflux disease (GERD). You have cancer of the esophagus. There is a problem with how food moves through your esophagus. In some cases, you may need this procedure repeated at a later time to dilate the esophagus gradually. Tell a health care provider about: Any allergies you have. All medicines you are taking, including vitamins, herbs, eye drops, creams, and over-the-counter medicines. Any problems you or family members have had with anesthetic medicines. Any blood disorders you have. Any surgeries you have had. Any medical conditions you have. Any antibiotic medicines you are required to take before dental procedures. Whether you are pregnant or may be pregnant. What are the risks? Generally, this is a safe procedure. However, problems may occur, including: Bleeding due to a tear in the lining of the esophagus. A hole, or perforation, in the esophagus. What happens before the procedure? Ask your health care provider about: Changing or stopping your regular medicines. This is especially important if you are taking diabetes medicines or blood thinners. Taking medicines such as aspirin and ibuprofen. These medicines can thin your blood. Do not take these medicines unless your health care provider tells you to take them. Taking over-the-counter medicines, vitamins, herbs, and supplements. Follow instructions from your health care provider about eating or drinking restrictions. Plan to have a responsible  adult take you home from the hospital or clinic. Plan to have a responsible adult care for you for the time you are told after you leave the hospital or clinic. This is important. What happens during the procedure? You may be given a medicine to help you relax (sedative). A numbing medicine may be sprayed into the back of your throat, or you may gargle the medicine. Your health care provider may perform the dilatation using various surgical instruments, such as: Simple dilators. This instrument is carefully placed in the esophagus to stretch it. Guided wire bougies. This involves using an endoscope to insert a wire into the esophagus. A dilator is passed over this wire to enlarge the esophagus. Then the wire is removed. Balloon dilators. An endoscope with a small balloon is inserted into the esophagus. The balloon is inflated to stretch the esophagus and open it up. The procedure may  vary among health care providers and hospitals. What can I expect after the procedure? Your blood pressure, heart rate, breathing rate, and blood oxygen level will be monitored until you leave the hospital or clinic. Your throat may feel slightly sore and numb. This will get better over time. You will not be allowed to eat or drink until your throat is no longer numb. When you are able to drink, urinate, and sit on the edge of the bed without nausea or dizziness, you may be able to return home. Follow these instructions at home: Take over-the-counter and prescription medicines only as told by your health care provider. If you were given a sedative during the procedure, it can affect you for several hours. Do not drive or operate machinery until your health care provider says that it is safe. Plan to have a responsible adult care for you for the time you are told. This is important. Follow instructions from your health care provider about any eating or drinking restrictions. Do not use any products that contain  nicotine or tobacco, such as cigarettes, e-cigarettes, and chewing tobacco. If you need help quitting, ask your health care provider. Keep all follow-up visits. This is important. Contact a health care provider if: You have a fever. You have pain that is not relieved by medicine. Get help right away if: You have chest pain. You have trouble breathing. You have trouble swallowing. You vomit blood. You have black, tarry, or bloody stools. These symptoms may represent a serious problem that is an emergency. Do not wait to see if the symptoms will go away. Get medical help right away. Call your local emergency services (911 in the [place] Do not drive yourself to the hospital. Summary Esophageal dilatation, also called esophageal dilation, is a procedure to widen or open a blocked or narrowed part of the esophagus. Plan to have a responsible adult take you home from the hospital or clinic. For this procedure, a numbing medicine may be sprayed into the back of your throat, or you may gargle the medicine. Do not drive or operate machinery until your health care provider says that it is safe. This information is not intended to replace advice given to you by your health care provider. Make sure you discuss any questions you have with your health care provider. Document Revised: 09/24/2019 Document Reviewed: 09/24/2019 Elsevier Patient Education  2024 Elsevier Inc. Monitored Anesthesia Care, Care After The following information offers guidance on how to care for yourself after your procedure. Your health care provider may also give you more specific instructions. If you have problems or questions, contact your health care provider. What can I expect after the procedure? After the procedure, it is common to have: Tiredness. Little or no memory about what happened during or after the procedure. Impaired judgment when it comes to making decisions. Nausea or vomiting. Some trouble with  balance. Follow these instructions at home: For the time period you were told by your health care provider:  Rest. Do not participate in activities where you could fall or become injured. Do not drive or use machinery. Do not drink alcohol. Do not take sleeping pills or medicines that cause drowsiness. Do not make important decisions or sign legal documents. Do not take care of children on your own. Medicines Take over-the-counter and prescription medicines only as told by your health care provider. If you were prescribed antibiotics, take them as told by your health care provider. Do not stop using the antibiotic even  if you start to feel better. Eating and drinking Follow instructions from your health care provider about what you may eat and drink. Drink enough fluid to keep your urine pale yellow. If you vomit: Drink clear fluids slowly and in small amounts as you are able. Clear fluids include water, ice chips, low-calorie sports drinks, and fruit juice that has water added to it (diluted fruit juice). Eat light and bland foods in small amounts as you are able. These foods include bananas, applesauce, rice, lean meats, toast, and crackers. General instructions  Have a responsible adult stay with you for the time you are told. It is important to have someone help care for you until you are awake and alert. If you have sleep apnea, surgery and some medicines can increase your risk for breathing problems. Follow instructions from your health care provider about wearing your sleep device: When you are sleeping. This includes during daytime naps. While taking prescription pain medicines, sleeping medicines, or medicines that make you drowsy. Do not use any products that contain nicotine or tobacco. These products include cigarettes, chewing tobacco, and vaping devices, such as e-cigarettes. If you need help quitting, ask your health care provider. Contact a health care provider if: You  feel nauseous or vomit every time you eat or drink. You feel light-headed. You are still sleepy or having trouble with balance after 24 hours. You get a rash. You have a fever. You have redness or swelling around the IV site. Get help right away if: You have trouble breathing. You have new confusion after you get home. These symptoms may be an emergency. Get help right away. Call 911. Do not wait to see if the symptoms will go away. Do not drive yourself to the hospital. This information is not intended to replace advice given to you by your health care provider. Make sure you discuss any questions you have with your health care provider. Document Revised: 10/03/2021 Document Reviewed: 10/03/2021 Elsevier Patient Education  2024 [employer].

## 2022-11-10 ENCOUNTER — Encounter (HOSPITAL_COMMUNITY): Payer: Self-pay

## 2022-11-10 ENCOUNTER — Encounter (HOSPITAL_COMMUNITY)
Admission: RE | Admit: 2022-11-10 | Discharge: 2022-11-10 | Disposition: A | Discharge status: Home / Self Care | Payer: 59 | Source: Ambulatory Visit | Attending: Gastroenterology | Admitting: Gastroenterology

## 2022-11-10 VITALS — BP 128/81 | HR 77 | Temp 98.3°F | Resp 18 | Ht 68.0 in | Wt 374.3 lb

## 2022-11-10 DIAGNOSIS — Z01812 Encounter for preprocedural laboratory examination: Secondary | ICD-10-CM | POA: Insufficient documentation

## 2022-11-10 DIAGNOSIS — Z01818 Encounter for other preprocedural examination: Secondary | ICD-10-CM

## 2022-11-10 LAB — POCT PREGNANCY, URINE: Preg Test, Ur: NEGATIVE

## 2022-11-14 ENCOUNTER — Encounter (HOSPITAL_COMMUNITY): Payer: Self-pay | Admitting: Certified Registered"

## 2022-11-14 ENCOUNTER — Telehealth (INDEPENDENT_AMBULATORY_CARE_PROVIDER_SITE_OTHER): Payer: Self-pay | Admitting: Gastroenterology

## 2022-11-14 ENCOUNTER — Encounter (HOSPITAL_COMMUNITY): Payer: Self-pay | Admitting: Gastroenterology

## 2022-11-14 ENCOUNTER — Encounter (HOSPITAL_COMMUNITY)
Admission: RE | Disposition: A | Discharge status: Home / Self Care | Payer: Self-pay | Source: Ambulatory Visit | Attending: Gastroenterology

## 2022-11-14 ENCOUNTER — Ambulatory Visit (HOSPITAL_COMMUNITY)
Admission: RE | Admit: 2022-11-14 | Discharge: 2022-11-14 | Disposition: A | Discharge status: Home / Self Care | Payer: 59 | Source: Ambulatory Visit | Attending: Gastroenterology | Admitting: Gastroenterology

## 2022-11-14 SURGERY — ESOPHAGOGASTRODUODENOSCOPY (EGD) WITH PROPOFOL
Anesthesia: Monitor Anesthesia Care

## 2022-11-14 NOTE — Progress Notes (Signed)
Left a voicemail for patient to return phone call to Short stay.  Was going to ask patient if she could come earlier for her procedure today.

## 2022-11-14 NOTE — Progress Notes (Signed)
EGD canceled for today due to system outage/equipment malfunction.  Patient did not want to wait.  Offered patient a time tomorrow 11/15/22 to return for procedure but patient states tomorrow does not work for her.  Patient states she will call the office to reschedule to another date.

## 2022-11-14 NOTE — Telephone Encounter (Signed)
Per Coralyn Pear hare having equipment issues today, so pt opted to cancel today and reschedule EGD. If you can call to reschedule please!   Contacted pt. Opening for July 2 but pt would like to wait until August due possibly having Jury Duty next month.

## 2022-11-14 NOTE — H&P (Signed)
Victoria Palmer is an 22 y.o. female.   Chief Complaint: GERD HPI: 22 y/o F with PMH anxiety, depression, lupus, coming for evaluation of heartburn and nausea with vomiting.  Patient has presented recurrent episodes of heartburn without dysphagia or odynophagia.  She has not been taking any PPIs.  Endorses having intermittent episodes of nausea and vomiting.  Past Medical History:  Diagnosis Date   Anxiety    Depression    History of mononucleosis    Lupus (HCC)    Discoid    Past Surgical History:  Procedure Laterality Date   APPENDECTOMY     BREAST REDUCTION SURGERY  09/2020   WISDOM TOOTH EXTRACTION      Family History  Problem Relation Age of Onset   Thyroid disease Mother    Depression Father    Other Father        stomach ulcers; back pain   Colon cancer Maternal Grandmother        age less than 32   Colon cancer Maternal Grandfather        age less than 60   Non-Hodgkin's lymphoma Paternal Grandmother    Endometriosis Cousin    Social History:  reports that she has been smoking cigarettes. She has never used smokeless tobacco. She reports current alcohol use. She reports current drug use.  Allergies:  Allergies  Allergen Reactions   Tea Tree Oil Rash   Amoxicillin Rash   Clindamycin/Lincomycin Rash    Medications Prior to Admission  Medication Sig Dispense Refill   calcium carbonate (TUMS EX) 750 MG chewable tablet Chew 1 tablet by mouth daily as needed for heartburn.     Multiple Vitamin (MULTIVITAMIN WITH MINERALS) TABS tablet Take 1 tablet by mouth 3 (three) times a week.     ibuprofen (ADVIL) 200 MG tablet Take 600 mg by mouth every 8 (eight) hours as needed (pain.).      No results found for this or any previous visit (from the past 48 hour(s)). No results found.  Review of Systems  All other systems reviewed and are negative.   Blood pressure 111/69, pulse 72, temperature 98.3 F (36.8 C), resp. rate 18, SpO2 98 %. Physical Exam    Assessment/Plan 22 y/o F with PMH anxiety, depression, lupus, coming for evaluation of heartburn and nausea with vomiting.  Will proceed with EGD.  Dolores Frame, MD 11/14/2022, 11:24 AM

## 2022-11-14 NOTE — Anesthesia Preprocedure Evaluation (Signed)
Anesthesia Evaluation  Patient identified by MRN, date of birth, ID band Patient awake    Reviewed: Allergy & Precautions, H&P , NPO status , Patient's Chart, lab work & pertinent test results, reviewed documented beta blocker date and time   Airway Mallampati: II  TM Distance: >3 FB Neck ROM: full    Dental no notable dental hx.    Pulmonary neg pulmonary ROS, Current Smoker   Pulmonary exam normal breath sounds clear to auscultation       Cardiovascular Exercise Tolerance: Good negative cardio ROS  Rhythm:regular Rate:Normal     Neuro/Psych  PSYCHIATRIC DISORDERS Anxiety Depression    negative neurological ROS  negative psych ROS   GI/Hepatic negative GI ROS, Neg liver ROS,GERD  ,,  Endo/Other  negative endocrine ROS    Renal/GU negative Renal ROS  negative genitourinary   Musculoskeletal   Abdominal   Peds  Hematology negative hematology ROS (+)   Anesthesia Other Findings   Reproductive/Obstetrics negative OB ROS                             Anesthesia Physical Anesthesia Plan  ASA: 3  Anesthesia Plan: General   Post-op Pain Management:    Induction:   PONV Risk Score and Plan: Propofol infusion  Airway Management Planned:   Additional Equipment:   Intra-op Plan:   Post-operative Plan:   Informed Consent: I have reviewed the patients History and Physical, chart, labs and discussed the procedure including the risks, benefits and alternatives for the proposed anesthesia with the patient or authorized representative who has indicated his/her understanding and acceptance.     Dental Advisory Given  Plan Discussed with: CRNA  Anesthesia Plan Comments:        Anesthesia Quick Evaluation

## 2022-12-06 ENCOUNTER — Telehealth (INDEPENDENT_AMBULATORY_CARE_PROVIDER_SITE_OTHER): Payer: Self-pay | Admitting: Gastroenterology

## 2022-12-06 NOTE — Telephone Encounter (Signed)
Pt contacted and EGD scheudled for 01/11/23 at 11:00 with Dr.Castaneda. pt states she has instructions from previously scheduled EGD; will send updated instructions just in case. Will fax PA form to [facility]. Will send letter and my chart message with pre op appt   No pa needed per [facility]

## 2022-12-06 NOTE — Telephone Encounter (Signed)
Pt contacted to schedule EGD ASA 3 with Dr.Castaneda. pt was in middle of taking one her finals and she will call back to schedule.

## 2022-12-06 NOTE — Telephone Encounter (Signed)
Pt contacted and EGD scheudled for 01/11/23 at 11:00 with Dr.Castaneda. pt states she has instructions from previously scheduled EGD; will send updated instructions just in case. Will fax PA form to [facility]. Will send letter and my chart message with pre op appt

## 2022-12-12 ENCOUNTER — Telehealth (INDEPENDENT_AMBULATORY_CARE_PROVIDER_SITE_OTHER): Payer: Self-pay | Admitting: Gastroenterology

## 2022-12-12 NOTE — Telephone Encounter (Signed)
Pt contacted to get rescheduled from 01/11/23 (EGD +/-ED ASA 3). Pt states she has been thinking about it and she would like to talk to her PCP to see if this is the route she needs to go. Advised pt to call back if she wanted to reschedule.

## 2023-01-10 ENCOUNTER — Other Ambulatory Visit (HOSPITAL_COMMUNITY): Payer: 59

## 2023-01-11 ENCOUNTER — Encounter (HOSPITAL_COMMUNITY): Payer: Self-pay

## 2023-01-11 ENCOUNTER — Ambulatory Visit (HOSPITAL_COMMUNITY): Admit: 2023-01-11 | Payer: 59 | Admitting: Gastroenterology

## 2023-01-11 SURGERY — ESOPHAGOGASTRODUODENOSCOPY (EGD) WITH PROPOFOL
Anesthesia: Monitor Anesthesia Care

## 2023-07-27 ENCOUNTER — Other Ambulatory Visit: Payer: Self-pay | Admitting: Family Medicine

## 2023-07-27 DIAGNOSIS — N6324 Unspecified lump in the left breast, lower inner quadrant: Secondary | ICD-10-CM

## 2023-07-31 ENCOUNTER — Ambulatory Visit
Admission: RE | Admit: 2023-07-31 | Discharge: 2023-07-31 | Disposition: A | Discharge status: Home / Self Care | Source: Ambulatory Visit | Attending: Family Medicine | Admitting: Family Medicine

## 2023-07-31 DIAGNOSIS — N6324 Unspecified lump in the left breast, lower inner quadrant: Secondary | ICD-10-CM | POA: Diagnosis present

## 2024-03-05 ENCOUNTER — Encounter (INDEPENDENT_AMBULATORY_CARE_PROVIDER_SITE_OTHER): Payer: Self-pay | Admitting: Gastroenterology
